# Patient Record
Sex: Male | Born: 1975 | Hispanic: No | Marital: Married | Smoking: Never smoker
Health system: Southern US, Community
[De-identification: ages and names within clinical notes are randomized; demographics above are authoritative.]

---

## 2010-01-01 ENCOUNTER — Telehealth: Payer: Self-pay | Admitting: Gastroenterology

## 2010-01-01 ENCOUNTER — Ambulatory Visit: Payer: Self-pay | Admitting: Gastroenterology

## 2010-01-01 ENCOUNTER — Encounter (INDEPENDENT_AMBULATORY_CARE_PROVIDER_SITE_OTHER): Payer: Self-pay | Admitting: *Deleted

## 2010-01-01 DIAGNOSIS — F458 Other somatoform disorders: Secondary | ICD-10-CM

## 2010-01-01 DIAGNOSIS — R1084 Generalized abdominal pain: Secondary | ICD-10-CM | POA: Insufficient documentation

## 2010-01-02 ENCOUNTER — Ambulatory Visit: Payer: Self-pay | Admitting: Gastroenterology

## 2010-01-05 ENCOUNTER — Ambulatory Visit (HOSPITAL_COMMUNITY): Admission: RE | Admit: 2010-01-05 | Discharge: 2010-01-05 | Payer: Self-pay | Admitting: Gastroenterology

## 2010-01-06 ENCOUNTER — Telehealth: Payer: Self-pay | Admitting: Gastroenterology

## 2010-01-12 ENCOUNTER — Encounter: Payer: Self-pay | Admitting: Gastroenterology

## 2010-01-23 ENCOUNTER — Ambulatory Visit (HOSPITAL_COMMUNITY): Admission: RE | Admit: 2010-01-23 | Discharge: 2010-01-23 | Payer: Self-pay | Admitting: Gastroenterology

## 2010-01-23 DIAGNOSIS — R7989 Other specified abnormal findings of blood chemistry: Secondary | ICD-10-CM | POA: Insufficient documentation

## 2010-01-26 ENCOUNTER — Telehealth: Payer: Self-pay | Admitting: Gastroenterology

## 2010-01-27 ENCOUNTER — Encounter: Payer: Self-pay | Admitting: Gastroenterology

## 2010-02-05 ENCOUNTER — Telehealth: Payer: Self-pay | Admitting: Gastroenterology

## 2010-05-28 ENCOUNTER — Ambulatory Visit: Payer: Self-pay | Admitting: Gastroenterology

## 2010-05-28 ENCOUNTER — Encounter: Payer: Self-pay | Admitting: Gastroenterology

## 2010-05-28 DIAGNOSIS — B370 Candidal stomatitis: Secondary | ICD-10-CM

## 2010-06-02 ENCOUNTER — Encounter: Payer: Self-pay | Admitting: Gastroenterology

## 2010-06-09 ENCOUNTER — Telehealth (INDEPENDENT_AMBULATORY_CARE_PROVIDER_SITE_OTHER): Payer: Self-pay | Admitting: *Deleted

## 2010-06-17 ENCOUNTER — Telehealth: Payer: Self-pay | Admitting: Gastroenterology

## 2010-07-07 NOTE — Letter (Signed)
Summary: EGD Instructions  [facility]  [address] [place], [place] [address]   Phone: [phone]  Fax: [phone]       VERDON FERRANTE    [birth year]    MRN: [id number]       Procedure Day /Date: Friday, 01/02/10     Arrival Time:  3:00     Procedure Time: 4:00     Location of Procedure:                    [facility] (4th Floor)    PREPARATION FOR ENDOSCOPY   On 01/02/10 THE DAY OF THE PROCEDURE:  1.   No solid foods, milk or milk products are allowed after midnight the night before your procedure.  2.   Do not drink anything colored red or purple.  Avoid juices with pulp.  No orange juice.  3.  You may drink clear liquids until 2:00, which is 2 hours before your procedure.                                                                                                CLEAR LIQUIDS INCLUDE: Water Jello Ice Popsicles Tea (sugar ok, no milk/cream) Powdered fruit flavored drinks Coffee (sugar ok, no milk/cream) Gatorade Juice: apple, white grape, white cranberry  Lemonade Clear bullion, consomm, broth Carbonated beverages (any kind) Strained chicken noodle soup Hard Candy   MEDICATION INSTRUCTIONS  Unless otherwise instructed, you should take regular prescription medications with a small sip of water as early as possible the morning of your procedure.                       OTHER INSTRUCTIONS  You will need a responsible adult at least 35 years of age to accompany you and drive you home.   This person must remain in the waiting room during your procedure.  Wear loose fitting clothing that is easily removed.  Leave jewelry and other valuables at home.  However, you may wish to bring a book to read or an iPod/MP3 player to listen to music as you wait for your procedure to start.  Remove all body piercing jewelry and leave at home.  Total time from sign-in until discharge is approximately 2-3 hours.  You should  go home directly after your procedure and rest.  You can resume normal activities the day after your procedure.  The day of your procedure you should not:   Drive   Make legal decisions   Operate machinery   Drink alcohol   Return to work  You will receive specific instructions about eating, activities and medications before you leave.    The above instructions have been reviewed and explained to me by   _______________________    I fully understand and can verbalize these instructions _____________________________ Date _________

## 2010-07-07 NOTE — Procedures (Signed)
Summary: Upper Endoscopy  Patient: [name] Note: All result statuses are Final unless otherwise noted.  Tests: (1) Upper Endoscopy (EGD)   EGD Upper Endoscopy       DONE     [facility]     [address]     [place], [place]  [address]           ENDOSCOPY PROCEDURE REPORT           PATIENT:  Carl Hinton, Carl Hinton  MR#:  [id number]     BIRTHDATE:  24-Jul-1975, 34 yrs. old  GENDER:  male           ENDOSCOPIST:  Vania Rea. Jarold Motto, MD, [facility]     Referred by:           PROCEDURE DATE:  01/02/2010     PROCEDURE:  EGD with biopsy     ASA CLASS:  Class I     INDICATIONS:  GAS,BLOATING AND EXCESSIVE BELCHING.NO RESPONSE TO     PPI RX.           MEDICATIONS:   Fentanyl 50 mcg IV, Versed 7 mg IV     TOPICAL ANESTHETIC:  Exactacain Spray           DESCRIPTION OF PROCEDURE:   After the risks benefits and     alternatives of the procedure were thoroughly explained, informed     consent was obtained.  The LB GIF-H180 [id number] endoscope was     introduced through the mouth and advanced to the second portion of     the duodenum, without limitations.  The instrument was slowly     withdrawn as the mucosa was fully examined.     <<PROCEDUREIMAGES>>           The upper, middle, and distal third of the esophagus were     carefully inspected and no abnormalities were noted. The z-line     was well seen at the GEJ. The endoscope was pushed into the fundus     which was normal including a retroflexed view. The antrum,gastric     body, first and second part of the duodenum were unremarkable.     DUODENAL AND GASTRIC AND CLO BX. DONE.    Retroflexed views     revealed no abnormalities.    The scope was then withdrawn from     the patient and the procedure completed.           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     RECOMMENDATIONS:     1) Await biopsy results  ULTRASOUND PENDING.MAY NEED GASTRIC EMPTYING SCAN.??? PRIMARY     GASTROPARESIS.           REPEAT EXAM:  No           ______________________________     Vania Rea. Jarold Motto, MD, [facility]           CC:           n.     eSIGNED:   Vania Rea. Patterson at 01/02/2010 04:23 PM           Carl Hinton, Carl Hinton, [id number]  Note: An exclamation mark (!) indicates a result that was not dispersed into the flowsheet. Document Creation Date: 01/02/2010 4:24 PM _______________________________________________________________________  (1) Order result status: Final Collection  or observation date-time: 01/02/2010 16:15 Requested date-time:  Receipt date-time:  Reported date-time:  Referring Physician:   Ordering Physician: Sheryn Bison [phone]) Specimen Source:  Source: Launa Grill Order Number: [id number] Lab site:

## 2010-07-07 NOTE — Assessment & Plan Note (Signed)
Summary: heart burn, abd pain...as.   History of Present Illness Visit Type: Initial Visit Primary GI MD: Sheryn Bison MD FACP FAGA Primary Provider: Javier Glazier, MD -[place], [place] Chief Complaint: Patient states that abdominal pain has subsided, however dyspepsia, bloating & burping still exist. Tis patient saw Dr. Loreta Ave 6 months ago. History of Present Illness:   35 year old Indian-American male who's been in good health all his life without serious medical or surgical problems. A year ago he developed probable prostatitis and was treated with a series of antibiotics including tetracycline, and has subsequently been bothered with abdominal gas, bloating, and excessive belching. He has tried multiple probiotics without improvement. He also has been on prolonged PPI therapy without improvement per GI consultation with Dr. Loreta Ave. He has not had previous endoscopy or ultrasound exams.  He denies typical reflux symptoms and has no burning or dysphagia. His appetite is good his weight is stable. He denies any specific hepatobiliary complaints or lower gastrointestinal problems. There is no history of excessive sorbitol or fructose use and he denies lactose intolerance. He is a vegetarian and takes daily multivitamins. There been no associated skin rashes, but he does have arthralgias in his hands. He is concerned that he may have a systemic yeast infection. Family history is noncontributory except for alcoholism associated liver disease in his father. The patient denies any previous episodes of hepatitis or pancreatitis. He works as a Research scientist (medical), does not smoke or use ethanol.   GI Review of Systems    Reports abdominal pain, acid reflux, belching, bloating, and  heartburn.     Location of  Abdominal pain: generalized.    Denies chest pain, dysphagia with liquids, dysphagia with solids, loss of appetite, nausea, vomiting, vomiting blood, weight loss, and  weight gain.      Reports  constipation.     Denies anal fissure, black tarry stools, change in bowel habit, diarrhea, diverticulosis, fecal incontinence, heme positive stool, hemorrhoids, irritable bowel syndrome, jaundice, light color stool, liver problems, rectal bleeding, and  rectal pain. Preventive Screening-Counseling & Management      Drug Use:  no.      Current Medications (verified): 1)  Probiotic Formula  Caps (Probiotic Product) .... Once Daily 2)  Multivitamins  Tabs (Multiple Vitamin) .... Once Daily 3)  Flax Seed Oil 1000 Mg Caps (Flaxseed (Linseed)) .... Once Daily  Allergies (verified): No Known Drug Allergies  Past History:  Past medical, surgical, family and social histories (including risk factors) reviewed for relevance to current acute and chronic problems.  Past Medical History: Back Pain GERD Urinary Tract Infection  Past Surgical History: Reviewed history from 12/29/2009 and no changes required. Right Eye Surgery  Family History: Reviewed history and no changes required. Family History of Diabetes:Uncle  Family History of Liver Disease/Cirrhosis:Father  Social History: Reviewed history from 12/29/2009 and no changes required. Patient has never smoked.  Alcohol Use - no Occupation: [employer] Drug Use - no Drug Use:  no  Review of Systems       The patient complains of allergy/sinus, back pain, confusion, fatigue, muscle pains/cramps, and thirst - excessive.  The patient denies anemia, anxiety-new, arthritis/joint pain, blood in urine, breast changes/lumps, change in vision, cough, coughing up blood, depression-new, fainting, fever, headaches-new, hearing problems, heart murmur, heart rhythm changes, itching, menstrual pain, night sweats, nosebleeds, pregnancy symptoms, shortness of breath, skin rash, sleeping problems, sore throat, swelling of feet/legs, swollen lymph glands, thirst - excessive , urination - excessive , urination changes/pain,  urine leakage, vision  changes, and voice change.         Chronic vague low back pain without incident use.  Vital Signs:  Patient profile:   35 year old male Height:      65 inches Weight:      122 pounds BMI:     20.38 Pulse rate:   76 / minute Pulse rhythm:   regular BP sitting:   110 / 72  (left arm) Cuff size:   regular  Vitals Entered By: June McMurray CMA Duncan Dull) (January 01, 2010 8:33 AM)  Physical Exam  General:  Well developed, well nourished, no acute distress.healthy appearing.   Head:  Normocephalic and atraumatic. Eyes:  PERRLA, no icterus.exam deferred to patient's ophthalmologist.   Mouth:  No deformity or lesions, dentition normal. Neck:  Supple; no masses or thyromegaly. Lungs:  Clear throughout to auscultation. Heart:  Regular rate and rhythm; no murmurs, rubs,  or bruits. Abdomen:  Soft, nontender and nondistended. No masses, hepatosplenomegaly or hernias noted. Normal bowel sounds. Msk:  Symmetrical with no gross deformities. Normal posture. Pulses:  Normal pulses noted. Extremities:  No clubbing, cyanosis, edema or deformities noted. Neurologic:  Alert and  oriented x4;  grossly normal neurologically. Cervical Nodes:  No significant cervical adenopathy. Psych:  Alert and cooperative. Normal mood and affect.   Impression & Recommendations:  Problem # 1:  AEROPHAGIA (ICD-306.4) Assessment Deteriorated Probable functional GI complaints---rule out H. pylori infection, large hiatal hernia, chronic malabsorption, versus occult bacterial overgrowth syndrome. Labs including celiac antibodies and H. pylori bodies have been ordered. We will proceed with endoscopy, gastric, small bowel biopsies. Ultrasound exam also has been ordered. Depending on his workup he may need additionally a gastric emptying scan, hydrogen breath testing, and perhaps esophageal manometry.  Patient Instructions: 1)  Please have your lab work drawn and fax the reuslts to [phone]. 2)  You are scheduled for an  ultrasound and an upper endoscopy. 3)  The medication list was reviewed and reconciled.  All changed / newly prescribed medications were explained.  A complete medication list was provided to the patient / caregiver.  Appended Document: heart burn, abd pain...as.    Clinical Lists Changes  Problems: Added new problem of ABDOMINAL PAIN, GENERALIZED (ICD-789.07) Orders: Added new Test order of EGD (EGD) - Signed Added new Test order of Ultrasound Abdomen (UAS) - Signed Added new Test order of EGD (EGD) - Signed      Appended Document: heart burn, abd pain...as.    Clinical Lists Changes  Orders: Added new Test order of Ultrasound Abdomen (UAS) - Signed      Appended Document: heart burn, abd pain...as. needs hemachromatosis genetic markers per elevated serum iron saturation on review of his meds records.  Appended Document: heart burn, abd pain...as. Pt notified.  Would like to have drawn at [facility] where he woeks.  Will come by and get order   Clinical Lists Changes  Problems: Added new problem of IRON, SERUM, ELEVATED (ICD-790.6) Orders: Added new Test order of T-Hemochromatosis [phone]) - Signed

## 2010-07-07 NOTE — Progress Notes (Signed)
Summary: labwork  Phone Note Call from Patient Call back at Home Phone [phone]   Caller: Patient Call For: Dr. Jarold Motto Reason for Call: Talk to Nurse Summary of Call: would like to verify that labwork order is ready and if wife can pick it up Initial call taken by: Vallarie Mare,  January 26, 2010 9:15 AM  Follow-up for Phone Call        Pt notified.  Lab order and info on gastrtoporosis at front desk.

## 2010-07-07 NOTE — Letter (Signed)
Summary: Patient Notice-Endo Biopsy Results  [facility]  [address] [place], [place] [address]   Phone: [phone]  Fax: [phone]        January 12, 2010 MRN: [id number]    [facility] [address] [place], [place]  [address]    Dear Carl Hinton,  I am pleased to inform you that the biopsies taken during your recent endoscopic examination did not show any evidence of cancer upon pathologic examination.  Additional information/recommendations:  __No further action is needed at this time.  Please follow-up with      your primary care physician for your other healthcare needs.  __ Please call [phone] to schedule a return visit to review      your condition.  _xx_ Continue with the treatment plan as outlined on the day of your      exam.Biopsies were negative for H. pylori infection.  __ You should have a repeat endoscopic examination for this problem              in _ months/years.   Please call us if you are having persistent problems or have questions about your condition that have not been fully answered at this time.  Sincerely,  Mardella Layman MD [facility]  This letter has been electronically signed by your physician.  Appended Document: Patient Notice-Endo Biopsy Results letter mailed

## 2010-07-07 NOTE — Progress Notes (Signed)
Summary: speak to nurse  Phone Note Call from Patient Call back at Home Phone [phone]   Caller: Patient Call For: Dr Jarold Motto Reason for Call: Talk to Nurse Summary of Call: Patient wants to speak to nurse regarding appt and bloodwork he had done today. Initial call taken by: Tawni Levy,  January 01, 2010 1:34 PM  Follow-up for Phone Call        Pt asks that we call [facility] re labs ordered today.  He is going to have lab drawn there,  He works for [employer] and can get labs drawn at no charge.  Call [phone]. Called and answered questions re H-pyori blood test.  (should be IGG) Follow-up by: Ashok Cordia RN,  January 01, 2010 2:03 PM

## 2010-07-07 NOTE — Progress Notes (Signed)
Summary: Gastric emptying scan  Phone Note Outgoing Call   Summary of Call: Per Dr. Jarold Motto.  If [place] negative, schedule gastric emtpying scan. Scheduled for 01/23/10 at 8:00 at [facility].   NPO after midnight. Initial call taken by: Ashok Cordia RN,  January 06, 2010 8:59 AM  Follow-up for Phone Call        Pt notified of appt for gastric emptying scan.  Notified of negative [place] results. Follow-up by: Ashok Cordia RN,  January 06, 2010 8:59 AM     Appended Document: Gastric emptying scan    Clinical Lists Changes  Orders: Added new Test order of Gastric Emptying Scan (GES) - Signed

## 2010-07-07 NOTE — Progress Notes (Signed)
Summary: Lab results  Phone Note Call from Patient Call back at Home Phone [phone]   Call For: Dr Jarold Motto Reason for Call: Lab or Test Results Initial call taken by: Leanor Kail [facility],  February 05, 2010 3:42 PM  Follow-up for Phone Call        LM for pt to call.  Ashok Cordia RN  February 05, 2010 4:43 PM  Pt asking for results of last labs drawn at [facility].  Called [facility] and resuts are to be faxed to [place].  Pt instructed to call back next week re these results. Follow-up by: Ashok Cordia RN,  February 06, 2010 2:26 PM     Appended Document: Lab results Results received.  To Dr. Jarold Motto for review.

## 2010-07-09 NOTE — Progress Notes (Signed)
Summary: Dry skin  Phone Note Other Incoming Call back at Pt walked in   Summary of Call: Patient walked in today to ask if Diflucan can cause dry skin and if he needed to worry about it. Patient was seen on 05/28/10 and Dr Jarold Motto ordered Diflucan 100 mg two times a daily day 1 and then 100mg  daily x 2 weeks. Patient stated he usually has oily skin and he noticed 3 days into the Diflucan the dryness. I did not notice any rash, redness or scaly areas where he was pointing on his face. I showed him in the Nurse's Drug Book where Diflucan may cause a rash. I informed him I will send Dr Jarold Motto a note and get back with him. Patient then asked if we received his lab results from [facility] and I found them which appeared normal. I offered him a copy and he refused stating he wouldn't know what he was looking at. I again will have Dr Jarold Motto look at the lab results and get back with him; [t stated understanding.  Initial call taken by: Graciella Freer RN  June 09, 2010 9:29 AM   Follow-up for Phone Call        [facility] RX...LAB TEST FOR HEMACHROMATOSIS IS NEGATIVE.Marland Kitchen Follow-up by: Mardella Layman MD Roxanne Gates 12:23 PM  Additional Follow-up for Phone Call Additional follow up Details #1::        Notified patient that Dr Jarold Motto instructed him to finish the rx for Diflucan and informed him the test for Hemachromatosis was negative. Patient stated understanding. Additional Follow-up by: Graciella Freer RN,  June 10, 2010 2:58 PM

## 2010-07-09 NOTE — Progress Notes (Signed)
Summary: update on meds  Phone Note Call from Patient Call back at Home Phone [phone]   Caller: Patient Call For: Dr Jarold Motto Reason for Call: Talk to Nurse Summary of Call: Patient is calling to update on rx given to him but wants to speak directly to nurse. Initial call taken by: Tawni Levy,  June 17, 2010 8:42 AM  Follow-up for Phone Call        Patient called in to report he has finished the Diflucan. He reports his mouth is better, but he still  has a few white patches left and he still has a problem with burping. Patient wants to know if you want to reorder the Diflucan? Graciella Freer RN  June 17, 2010 1:38 PM  Follow-up by: Mardella Layman MD FACG,  June 17, 2010 2:28 PM  Additional Follow-up for Phone Call Additional follow up Details #1::        HIS RX. SHOULD BE COMPLETE Additional Follow-up by: Mardella Layman MD FACG,  June 17, 2010 2:28 PM    Additional Follow-up for Phone Call Additional follow up Details #2::    Informed patient per Dr Jarold Motto , his rx and GI workup is complete. Call for further problems. Patient stated understanding. Follow-up by: Graciella Freer RN,  June 17, 2010 3:54 PM

## 2010-07-09 NOTE — Assessment & Plan Note (Signed)
Summary: abd pain...as.   History of Present Illness Visit Type: Follow-up Visit Primary GI MD: Sheryn Bison MD FACP FAGA Primary Provider: Javier Glazier, MD -[place], [place] Chief Complaint: still c/o abdominal pain and c/o thrush in mouth History of Present Illness:   35 year old male patient who has chronic functional dyspepsia. He has had recent GI evaluation including endoscopy, ultrasonography, and gastric emptying scan all of which have been normal. He continues with gas, bloating, and is convinced that he has a systemic Candida albicans infection. He does complain of oral stomatitis but no real difficulty swallowing. He was on a course of amoxicillin 1-1/2-2 weeks ago for a inspiratory infection. He currently is on probiotics, multivitamins, and a natural homeopathy solution for Candida.  Patient has had an approximate 10 pound weight loss over the last several years, but denies any symptoms of malabsorption, and there is no history of hepatitis or pancreatitis. Family history is noncontributory. He denies systemic complaints such as fever, chills, skin rashes, joint swelling et Karie Soda.   GI Review of Systems    Reports abdominal pain and  belching.      Denies acid reflux, bloating, chest pain, dysphagia with liquids, dysphagia with solids, heartburn, loss of appetite, nausea, vomiting, vomiting blood, weight loss, and  weight gain.        Denies anal fissure, black tarry stools, change in bowel habit, constipation, diarrhea, diverticulosis, fecal incontinence, heme positive stool, hemorrhoids, irritable bowel syndrome, jaundice, light color stool, liver problems, rectal bleeding, and  rectal pain.    Current Medications (verified): 1)  Probiotic Formula  Caps (Probiotic Product) .... Once Daily 2)  Multivitamins  Tabs (Multiple Vitamin) .... Once Daily 3)  Fish Oil 1000 Mg Caps (Omega-3 Fatty Acids) .Marland Kitchen.. 1 By Mouth Once Daily 4)  Kolorex .Marland KitchenMarland Kitchen. 1 By Mouth Once Daily  Allergies  (verified): No Known Drug Allergies  Past History:  Past medical, surgical, family and social histories (including risk factors) reviewed for relevance to current acute and chronic problems.  Past Medical History: Reviewed history from 01/01/2010 and no changes required. Back Pain GERD Urinary Tract Infection  Past Surgical History: Reviewed history from 12/29/2009 and no changes required. Right Eye Surgery  Family History: Reviewed history from 01/01/2010 and no changes required. Family History of Diabetes:Uncle  Family History of Liver Disease/Cirrhosis:Father No FH of Colon Cancer:  Social History: Reviewed history from 01/01/2010 and no changes required. Patient has never smoked.  Alcohol Use - no Occupation: [employer] Drug Use - no Married 1 boy Daily Caffeine Use  Review of Systems  The patient denies allergy/sinus, anemia, anxiety-new, arthritis/joint pain, back pain, blood in urine, breast changes/lumps, change in vision, confusion, cough, coughing up blood, depression-new, fainting, fatigue, fever, headaches-new, hearing problems, heart murmur, heart rhythm changes, itching, muscle pains/cramps, night sweats, nosebleeds, shortness of breath, skin rash, sleeping problems, sore throat, swelling of feet/legs, swollen lymph glands, thirst - excessive, urination - excessive, urination changes/pain, urine leakage, vision changes, and voice change.         complaints of oral stomatitis.  Vital Signs:  Patient profile:   35 year old male Height:      65 inches Weight:      121 pounds BMI:     20.21 Pulse rate:   84 / minute Pulse rhythm:   regular BP sitting:   118 / 78  (left arm)  Vitals Entered By: Milford Cage NCMA (May 28, 2010 8:52 AM)  Physical Exam  General:  Well  developed, well nourished, no acute distress.healthy appearing.   Head:  Normocephalic and atraumatic. Eyes:  PERRLA, no icterus.exam deferred to patient's ophthalmologist.     Mouth:  inspection of the mucous membranes is fairly unremarkable although there is some white exudate in the left buccal area. Neck:  Supple; no masses or thyromegaly. Lungs:  Clear throughout to auscultation. Heart:  Regular rate and rhythm; no murmurs, rubs,  or bruits. Abdomen:  Soft, nontender and nondistended. No masses, hepatosplenomegaly or hernias noted. Normal bowel sounds. Extremities:  No clubbing, cyanosis, edema or deformities noted. Neurologic:  Alert and  oriented x4;  grossly normal neurologically. Psych:  Alert and cooperative. Normal mood and affect.   Impression & Recommendations:  Problem # 1:  IRON, SERUM, ELEVATED (ICD-790.6) Assessment Unchanged Hemochromatosis genetic studies were negative. We will repeat his labs through [employer] where he is employed. I have ordered CBC, anemia profile, metabolic profile, sprue serologies, and immunoelectrophoresis.  Problem # 2:  ABDOMINAL PAIN, GENERALIZED (ICD-789.07) Assessment: Improved  Problem # 3:  CANDIDIASIS OF MOUTH (ICD-112.0) Assessment: Unchanged Probable mild Candida stomatitis related to recent antibiotic use. The patient is greatly concerned that he has a systemic yeast infection. There is no history of immunodeficiency, prednisone use, etc. However, I have decided to treat him with Diflucan 100 mg twice a day on day one followed by 100 mg a day for 2 weeks, and we will see how he does symptomatically. Again, labs have been ordered to exclude any immunodeficiency syndrome or other abnormalities. There is no history of diabetes or other endocrine dysfunction, her blood glucose and thyroid functions also ordered.  Problem # 4:  AEROPHAGIA (ICD-306.4) Assessment: Unchanged  Patient Instructions: 1)  Copy sent to : Javier Glazier, MD -[place], [place] 2)  We are sending in your prescription today to your pharmacy 3)  Orders were given to you today to take to [facility] to have your labs drawn there 4)  Please fax lab  results to [phone] 5)  The medication list was reviewed and reconciled.  All changed / newly prescribed medications were explained.  A complete medication list was provided to the patient / caregiver. Prescriptions: DIFLUCAN 100 MG TABS (FLUCONAZOLE) 1 by mouth two times a day for the first day the once daily for 14 days  #16 x 0   Entered by:   Merri Ray CMA (AAMA)   Authorized by:   Mardella Layman MD [employer]   Signed by:   Merri Ray CMA (AAMA) on 05/28/2010   Method used:   Electronically to        [id number] (retail)       [address] [place], [place]  [address]       Ph: [phone]       Fax: [phone]   RxID:   [id number]

## 2011-07-15 ENCOUNTER — Ambulatory Visit
Admission: RE | Admit: 2011-07-15 | Discharge: 2011-07-15 | Disposition: A | Discharge status: Home / Self Care | Payer: No Typology Code available for payment source | Source: Ambulatory Visit | Attending: Infectious Diseases | Admitting: Infectious Diseases

## 2011-07-15 ENCOUNTER — Other Ambulatory Visit: Payer: Self-pay | Admitting: Infectious Diseases

## 2011-07-15 DIAGNOSIS — R7611 Nonspecific reaction to tuberculin skin test without active tuberculosis: Secondary | ICD-10-CM

## 2011-07-15 IMAGING — CR DG CHEST 1V
1 series · 1 of 1 positions shown · non-contrast
Comparison: None.

CLINICAL DATA: Positive TB test

CHEST - 1 VIEW

[view not recorded]
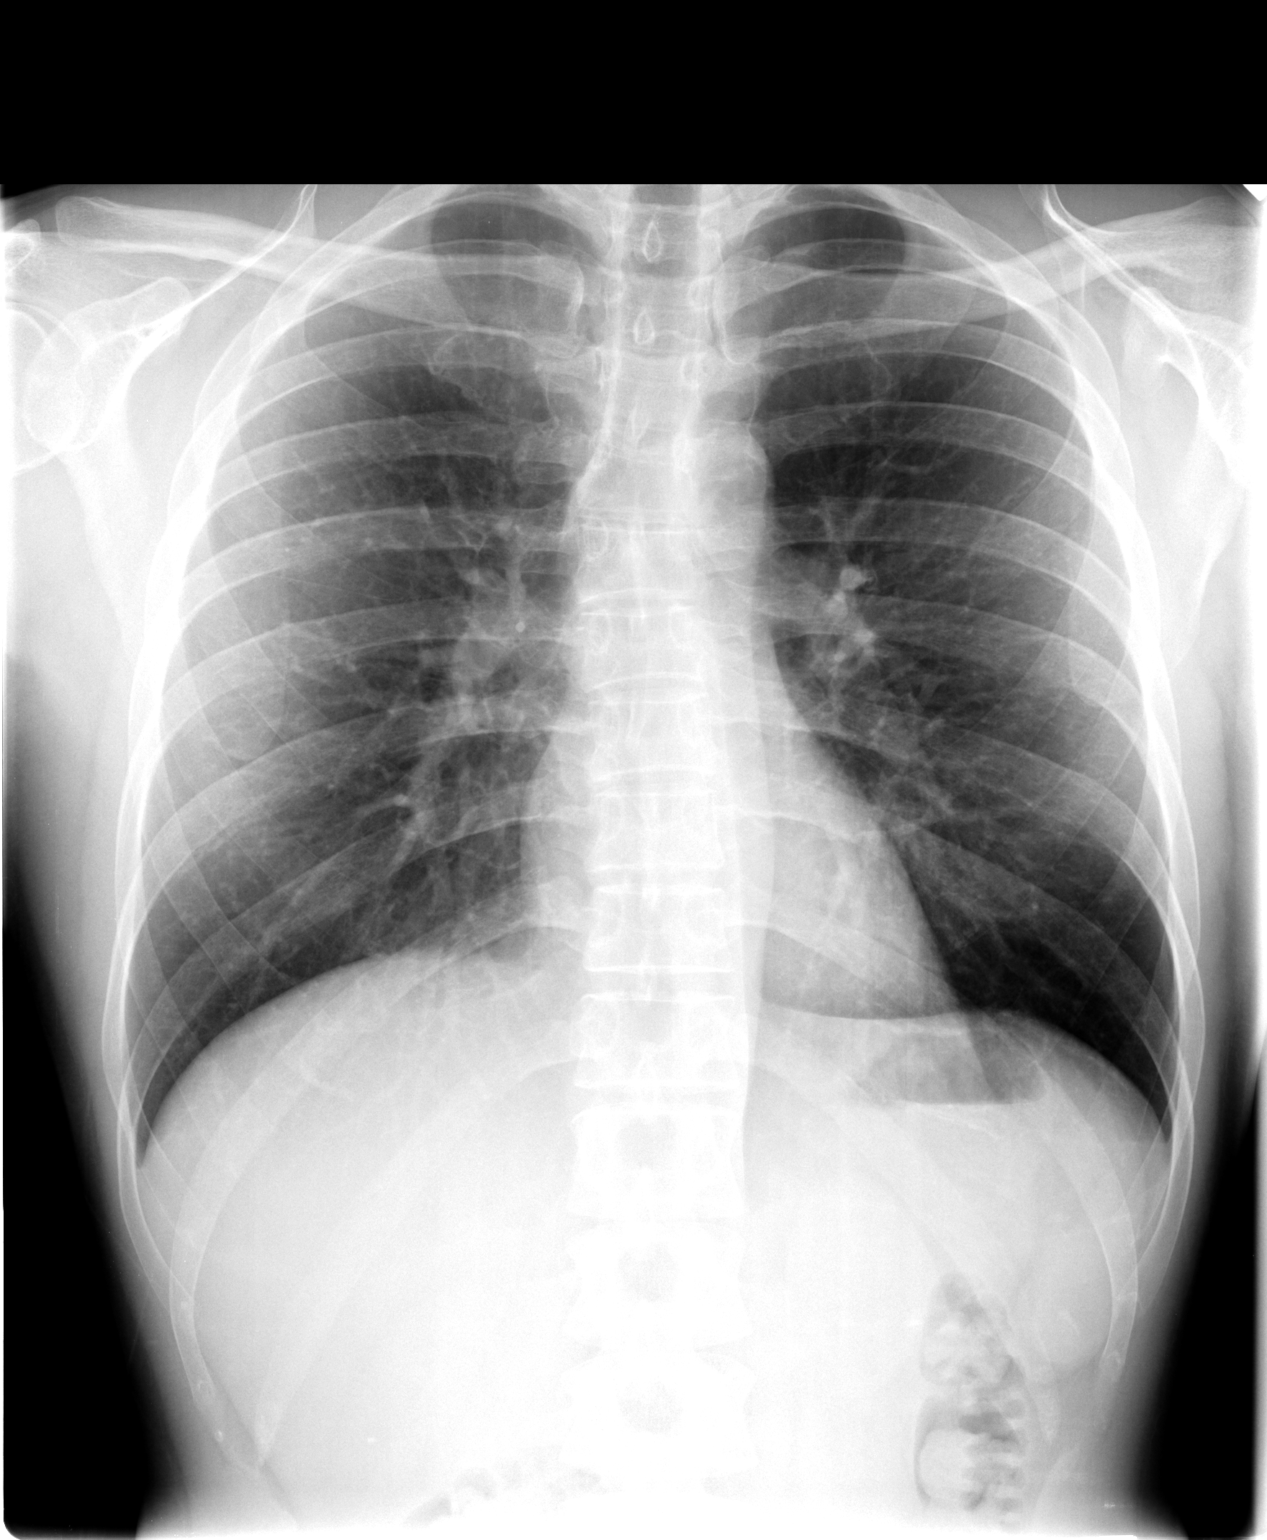

[1 of 1 positions shown; findings below may reference images not displayed]

FINDINGS: The lungs are clear.  No evidence of prior tuberculous
infection is seen.  Mediastinal contours appear normal.  The heart
is within normal limits in size.  No bony abnormality is seen.
IMPRESSION: No active lung disease.

## 2015-07-22 ENCOUNTER — Encounter: Payer: Self-pay | Admitting: Gastroenterology

## 2016-03-04 DIAGNOSIS — Z0000 Encounter for general adult medical examination without abnormal findings: Secondary | ICD-10-CM | POA: Diagnosis not present

## 2016-03-04 DIAGNOSIS — Z136 Encounter for screening for cardiovascular disorders: Secondary | ICD-10-CM | POA: Diagnosis not present

## 2016-03-09 DIAGNOSIS — Z0000 Encounter for general adult medical examination without abnormal findings: Secondary | ICD-10-CM | POA: Diagnosis not present

## 2016-09-23 DIAGNOSIS — N481 Balanitis: Secondary | ICD-10-CM | POA: Diagnosis not present

## 2017-03-07 DIAGNOSIS — Z1329 Encounter for screening for other suspected endocrine disorder: Secondary | ICD-10-CM | POA: Diagnosis not present

## 2017-03-07 DIAGNOSIS — Z0000 Encounter for general adult medical examination without abnormal findings: Secondary | ICD-10-CM | POA: Diagnosis not present

## 2017-03-07 DIAGNOSIS — Z13220 Encounter for screening for lipoid disorders: Secondary | ICD-10-CM | POA: Diagnosis not present

## 2017-03-07 DIAGNOSIS — Z114 Encounter for screening for human immunodeficiency virus [HIV]: Secondary | ICD-10-CM | POA: Diagnosis not present

## 2017-03-07 DIAGNOSIS — Z125 Encounter for screening for malignant neoplasm of prostate: Secondary | ICD-10-CM | POA: Diagnosis not present

## 2017-03-09 DIAGNOSIS — Z0000 Encounter for general adult medical examination without abnormal findings: Secondary | ICD-10-CM | POA: Diagnosis not present

## 2017-03-09 DIAGNOSIS — Z6821 Body mass index (BMI) 21.0-21.9, adult: Secondary | ICD-10-CM | POA: Diagnosis not present

## 2017-10-25 DIAGNOSIS — E782 Mixed hyperlipidemia: Secondary | ICD-10-CM | POA: Diagnosis not present

## 2017-10-25 DIAGNOSIS — R739 Hyperglycemia, unspecified: Secondary | ICD-10-CM | POA: Diagnosis not present

## 2017-10-25 DIAGNOSIS — Z6821 Body mass index (BMI) 21.0-21.9, adult: Secondary | ICD-10-CM | POA: Diagnosis not present

## 2017-10-25 DIAGNOSIS — R5383 Other fatigue: Secondary | ICD-10-CM | POA: Diagnosis not present

## 2018-03-10 DIAGNOSIS — Z0000 Encounter for general adult medical examination without abnormal findings: Secondary | ICD-10-CM | POA: Diagnosis not present

## 2018-03-10 DIAGNOSIS — Z13220 Encounter for screening for lipoid disorders: Secondary | ICD-10-CM | POA: Diagnosis not present

## 2018-03-10 DIAGNOSIS — Z114 Encounter for screening for human immunodeficiency virus [HIV]: Secondary | ICD-10-CM | POA: Diagnosis not present

## 2018-03-10 DIAGNOSIS — Z125 Encounter for screening for malignant neoplasm of prostate: Secondary | ICD-10-CM | POA: Diagnosis not present

## 2018-03-10 DIAGNOSIS — Z1329 Encounter for screening for other suspected endocrine disorder: Secondary | ICD-10-CM | POA: Diagnosis not present

## 2018-03-13 DIAGNOSIS — Z6821 Body mass index (BMI) 21.0-21.9, adult: Secondary | ICD-10-CM | POA: Diagnosis not present

## 2018-03-13 DIAGNOSIS — Z0000 Encounter for general adult medical examination without abnormal findings: Secondary | ICD-10-CM | POA: Diagnosis not present

## 2018-03-13 DIAGNOSIS — Z23 Encounter for immunization: Secondary | ICD-10-CM | POA: Diagnosis not present

## 2019-03-14 DIAGNOSIS — R5383 Other fatigue: Secondary | ICD-10-CM | POA: Diagnosis not present

## 2019-03-14 DIAGNOSIS — E559 Vitamin D deficiency, unspecified: Secondary | ICD-10-CM | POA: Diagnosis not present

## 2019-03-14 DIAGNOSIS — D509 Iron deficiency anemia, unspecified: Secondary | ICD-10-CM | POA: Diagnosis not present

## 2019-03-14 DIAGNOSIS — Z0000 Encounter for general adult medical examination without abnormal findings: Secondary | ICD-10-CM | POA: Diagnosis not present

## 2019-03-21 DIAGNOSIS — Z6821 Body mass index (BMI) 21.0-21.9, adult: Secondary | ICD-10-CM | POA: Diagnosis not present

## 2019-03-21 DIAGNOSIS — E559 Vitamin D deficiency, unspecified: Secondary | ICD-10-CM | POA: Diagnosis not present

## 2019-03-21 DIAGNOSIS — L853 Xerosis cutis: Secondary | ICD-10-CM | POA: Diagnosis not present

## 2019-03-21 DIAGNOSIS — L84 Corns and callosities: Secondary | ICD-10-CM | POA: Diagnosis not present

## 2019-03-21 DIAGNOSIS — Z0000 Encounter for general adult medical examination without abnormal findings: Secondary | ICD-10-CM | POA: Diagnosis not present

## 2019-04-30 DIAGNOSIS — Z20828 Contact with and (suspected) exposure to other viral communicable diseases: Secondary | ICD-10-CM | POA: Diagnosis not present

## 2019-12-13 DIAGNOSIS — D539 Nutritional anemia, unspecified: Secondary | ICD-10-CM | POA: Diagnosis not present

## 2019-12-13 DIAGNOSIS — R5383 Other fatigue: Secondary | ICD-10-CM | POA: Diagnosis not present

## 2019-12-13 DIAGNOSIS — Z0000 Encounter for general adult medical examination without abnormal findings: Secondary | ICD-10-CM | POA: Diagnosis not present

## 2019-12-13 DIAGNOSIS — E291 Testicular hypofunction: Secondary | ICD-10-CM | POA: Diagnosis not present

## 2020-01-03 DIAGNOSIS — Z20822 Contact with and (suspected) exposure to covid-19: Secondary | ICD-10-CM | POA: Diagnosis not present

## 2020-01-22 DIAGNOSIS — E611 Iron deficiency: Secondary | ICD-10-CM | POA: Diagnosis not present

## 2020-01-22 DIAGNOSIS — K219 Gastro-esophageal reflux disease without esophagitis: Secondary | ICD-10-CM | POA: Diagnosis not present

## 2020-01-22 DIAGNOSIS — R5383 Other fatigue: Secondary | ICD-10-CM | POA: Diagnosis not present

## 2020-04-27 ENCOUNTER — Ambulatory Visit (HOSPITAL_COMMUNITY)
Admission: EM | Admit: 2020-04-27 | Discharge: 2020-04-28 | Disposition: A | Discharge status: Home / Self Care | Payer: BC Managed Care – PPO | Attending: Urology | Admitting: Urology

## 2020-04-27 ENCOUNTER — Other Ambulatory Visit: Payer: Self-pay

## 2020-04-27 ENCOUNTER — Emergency Department (HOSPITAL_COMMUNITY): Payer: BC Managed Care – PPO

## 2020-04-27 ENCOUNTER — Encounter (HOSPITAL_COMMUNITY): Payer: Self-pay | Admitting: Emergency Medicine

## 2020-04-27 DIAGNOSIS — N4889 Other specified disorders of penis: Secondary | ICD-10-CM | POA: Diagnosis not present

## 2020-04-27 DIAGNOSIS — Z20822 Contact with and (suspected) exposure to covid-19: Secondary | ICD-10-CM | POA: Diagnosis not present

## 2020-04-27 DIAGNOSIS — N433 Hydrocele, unspecified: Secondary | ICD-10-CM | POA: Insufficient documentation

## 2020-04-27 NOTE — Consult Note (Addendum)
Urology Consult   Physician requesting consult: Tilden Fossa, MD  Reason for consult: Penile pain, possible fracture  History of Present Illness: Carl Hinton is a 44 y.o. who presents to the ED complaining of injury to his penis.  He states that around 2 PM today he was in a hot tub with his wife having sexual intercourse and pulled out noticing that he had penile edema.  He denies hearing or feeling a pop.  He denies any significant penile pain.  He does have some discomfort with associated swelling.  He did experience detumescence however this does not appear to be all of a sudden.  He has not tried to obtain an erection after this event.  He denies hematuria or blood per urethral meatus.  He denies dysuria, frequency or urgency.  He states he has been voiding without difficulty.  He did present to an urgent care earlier and was transferred to [facility] for further care.  He denies a history of voiding or storage urinary symptoms, hematuria, UTIs, STDs, urolithiasis, GU malignancy/trauma/surgery.  History reviewed. No pertinent past medical history.  History reviewed. No pertinent surgical history.  Medications:  Home meds:  No current facility-administered medications on file prior to encounter.   No current outpatient medications on file prior to encounter.     Scheduled Meds: Continuous Infusions: PRN Meds:.  Allergies: Not on File  No family history on file.  Social History:  reports that he has never smoked. He has never used smokeless tobacco. No history on file for alcohol use and drug use.  ROS: A complete review of systems was performed.  All systems are negative except for pertinent findings as noted.  Physical Exam:  Vital signs in last 24 hours: Temp:  [98 F (36.7 C)] 98 F (36.7 C) (11/21 1621) Pulse Rate:  [63-89] 63 (11/21 1848) Resp:  [18-19] 18 (11/21 1848) BP: (123-136)/(78) 123/78 (11/21 1848) SpO2:  [100 %] 100 % (11/21  1848) Constitutional:  Alert and oriented, No acute distress Cardiovascular: Regular rate and rhythm Respiratory: Normal respiratory effort, Lungs clear bilaterally GI: Abdomen is soft, nontender, nondistended, no abdominal masses Genitourinary: No CVAT.  Penile edema and ecchymosis present circumferentially around the penile shaft.  Meatus is patent with no blood at the meatus.  No significant tenderness.  I am unable to palpate a tunical defect however this is difficult given the amount of edema. Bilateral testicles are nontender with no masses.  The ecchymosis and edema stops at the base of the penile shaft. Neurologic: Grossly intact, no focal deficits Psychiatric: Normal mood and affect  Laboratory Data:  No results for input(s): WBC, HGB, HCT, PLT in the last 72 hours.  No results for input(s): NA, K, CL, GLUCOSE, BUN, CALCIUM, CREATININE in the last 72 hours.  Invalid input(s): CO3   No results found for this or any previous visit (from the past 24 hour(s)). No results found for this or any previous visit (from the past 240 hour(s)).  Renal Function: No results for input(s): CREATININE in the last 168 hours. CrCl cannot be calculated (No successful lab value found.).  Radiologic Imaging: No results found.  I independently reviewed the above imaging studies.  Impression/Recommendation 1. Penile injury/questionable penile fracture  -I explained options to proceed including penile exploration or MRI of the penis to evaluate for possible fracture.  I explained that his case is not straightforward as he did not notice a pop or develop significant pain during his event.  He  merely noticed swelling after pulling out.  He elects to obtain an MRI of the penis to evaluate for penile fracture.  If evidence for fracture, we will proceed with penile exploration and repair of fracture.  Discussed risks and benefits.  Jannifer Hick 04/27/2020, 7:35 PM  Matt R. Dayten Juba MD [facility]   Pager: [phone]   CC: Tilden Fossa, MD  Addendum 12:21AM: Just discussed with Carl Hinton with radiology. 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 Will proceed to OR for penile exploration, repair of defect. Discussed with patient. Still awaiting COVID test result prior to proceeding.  Matt R. Neah Sporrer MD [facility]  Pager: [phone]

## 2020-04-27 NOTE — ED Provider Notes (Signed)
[facility]-EMERGENCY DEPT Provider Note   CSN: [id number] Arrival date & time: 04/27/20  1559     History Chief Complaint  Patient presents with   Penis Pain    Monterius H Segundo is a 44 y.o. male.  The history is provided by the patient.  Penis Pain   Rivan H Sievers is a 44 y.o. male who presents to the Emergency Department complaining of penile injury. He presents the emergency department complaining of swelling of the shaft of his penis that occurred earlier today. He states that he was having intercourse with his wife and after about 4 to 5 minutes when he pulled out he noticed that there was significant circumferential swelling to his penile shaft. There is mild local discomfort but no significant pain. He is able to void without difficulty. He has a history of iron deficiency, no additional medical problems. No penile discharge, dysuria. He was seen in urgent care and treated with a steroid shot and referred to the emergency department for further evaluation.    History reviewed. No pertinent past medical history.  Patient Active Problem List   Diagnosis Date Noted   CANDIDIASIS OF MOUTH 05/28/2010   IRON, SERUM, ELEVATED 01/23/2010   AEROPHAGIA 01/01/2010   ABDOMINAL PAIN, GENERALIZED 01/01/2010    History reviewed. No pertinent surgical history.     No family history on file.  Social History   Tobacco Use   Smoking status: Never Smoker   Smokeless tobacco: Never Used  Substance Use Topics   Alcohol use: Not on file   Drug use: Not on file    Home Medications Prior to Admission medications   Not on File    Allergies    Patient has no allergy information on record.  Review of Systems   Review of Systems  Genitourinary: Positive for penile pain.  All other systems reviewed and are negative.   Physical Exam Updated Vital Signs BP 131/88    Pulse 77    Temp 98 F (36.7 C) (Oral)    Resp 17    SpO2 100%   Physical  Exam Vitals and nursing note reviewed.  Constitutional:      Appearance: He is well-developed.  HENT:     Head: Normocephalic and atraumatic.  Cardiovascular:     Rate and Rhythm: Normal rate and regular rhythm.     Heart sounds: No murmur heard.   Pulmonary:     Effort: Pulmonary effort is normal. No respiratory distress.     Breath sounds: Normal breath sounds.  Abdominal:     Palpations: Abdomen is soft.     Tenderness: There is no abdominal tenderness. There is no guarding or rebound.  Genitourinary:    Comments: Circumcised penis. Normal testicles. There is no significant penile tenderness. There is moderate shaft edema with ecchymosis on the dorsal surface. Musculoskeletal:        General: No tenderness.  Skin:    General: Skin is warm and dry.  Neurological:     Mental Status: He is alert and oriented to person, place, and time.  Psychiatric:        Behavior: Behavior normal.     ED Results / Procedures / Treatments   Labs (all labs ordered are listed, but only abnormal results are displayed) Labs Reviewed - No data to display  EKG None  Radiology No results found.  Procedures Procedures (including critical care time)  Medications Ordered in ED Medications - No data to display  ED Course  I have reviewed the triage vital signs and the nursing notes.  Pertinent labs & imaging results that were available during my care of the patient were reviewed by me and considered in my medical decision making (see chart for details).    MDM Rules/Calculators/A&P                         Patient here for evaluation of swelling to the penile shaft after intercourse. He has swelling throughout this shop with ecchymosis, mild local pain. Discussed with Dr. Cardell Peach, with urology who evaluated the patient in consult. Plan to obtain MRI to further evaluate extent of injuries. Patient care transferred pending further urology recommendations.  Final Clinical Impression(s) / ED  Diagnoses Final diagnoses:  Penile pain    Rx / DC Orders ED Discharge Orders    None       Tilden Fossa, MD 04/27/20 2315

## 2020-04-27 NOTE — ED Triage Notes (Signed)
Patient here from home reporting penis shaft pain after intercourse today. Swelling. Denies bleeding.

## 2020-04-28 ENCOUNTER — Emergency Department (HOSPITAL_COMMUNITY): Payer: BC Managed Care – PPO | Admitting: Certified Registered Nurse Anesthetist

## 2020-04-28 ENCOUNTER — Encounter (HOSPITAL_COMMUNITY)
Admission: EM | Disposition: A | Discharge status: Home / Self Care | Payer: Self-pay | Source: Home / Self Care | Attending: Emergency Medicine

## 2020-04-28 HISTORY — PX: REPAIR OF FRACTURED PENIS: SHX6063

## 2020-04-28 LAB — RESP PANEL BY RT-PCR (FLU A&B, COVID) ARPGX2
Influenza A by PCR: NEGATIVE
Influenza B by PCR: NEGATIVE
SARS Coronavirus 2 by RT PCR: NEGATIVE

## 2020-04-28 SURGERY — REPAIR, FRACTURE, PENIS
Anesthesia: General

## 2020-04-28 MED ORDER — CEFAZOLIN SODIUM-DEXTROSE 2-3 GM-%(50ML) IV SOLR
INTRAVENOUS | Status: DC | PRN
  Administered 2020-04-28: 2 g via INTRAVENOUS

## 2020-04-28 MED ORDER — SUGAMMADEX SODIUM 500 MG/5ML IV SOLN
INTRAVENOUS | Status: AC
  Filled 2020-04-28: qty 5

## 2020-04-28 MED ORDER — DOCUSATE SODIUM 100 MG PO CAPS: 100.0000 mg | ORAL_CAPSULE | Freq: Every day | ORAL | 1 refills | Status: AC | PRN

## 2020-04-28 MED ORDER — MIDAZOLAM HCL 2 MG/2ML IJ SOLN
INTRAMUSCULAR | Status: AC
  Filled 2020-04-28: qty 2

## 2020-04-28 MED ORDER — ACETAMINOPHEN 10 MG/ML IV SOLN: 1000.0000 mg | Freq: Once | INTRAVENOUS | Status: DC | PRN

## 2020-04-28 MED ORDER — LACTATED RINGERS IV SOLN: INTRAVENOUS | Status: DC | PRN

## 2020-04-28 MED ORDER — CIPROFLOXACIN HCL 500 MG PO TABS: 500.0000 mg | ORAL_TABLET | Freq: Two times a day (BID) | ORAL | 0 refills | Status: AC

## 2020-04-28 MED ORDER — ROCURONIUM BROMIDE 10 MG/ML (PF) SYRINGE
PREFILLED_SYRINGE | INTRAVENOUS | Status: DC | PRN
  Administered 2020-04-28: 20 mg via INTRAVENOUS
  Administered 2020-04-28: 50 mg via INTRAVENOUS

## 2020-04-28 MED ORDER — FENTANYL CITRATE (PF) 100 MCG/2ML IJ SOLN: 25.0000 ug | INTRAMUSCULAR | Status: DC | PRN

## 2020-04-28 MED ORDER — PROPOFOL 10 MG/ML IV BOLUS
INTRAVENOUS | Status: AC
  Filled 2020-04-28: qty 20

## 2020-04-28 MED ORDER — ACETAMINOPHEN 160 MG/5ML PO SOLN: 1000.0000 mg | Freq: Once | ORAL | Status: DC | PRN

## 2020-04-28 MED ORDER — MIDAZOLAM HCL 5 MG/5ML IJ SOLN
INTRAMUSCULAR | Status: DC | PRN
  Administered 2020-04-28: 2 mg via INTRAVENOUS

## 2020-04-28 MED ORDER — HYDROMORPHONE HCL 1 MG/ML IJ SOLN: 0.2500 mg | INTRAMUSCULAR | Status: DC | PRN

## 2020-04-28 MED ORDER — FENTANYL CITRATE (PF) 100 MCG/2ML IJ SOLN
INTRAMUSCULAR | Status: AC
  Filled 2020-04-28: qty 2

## 2020-04-28 MED ORDER — CEFAZOLIN SODIUM-DEXTROSE 2-4 GM/100ML-% IV SOLN
INTRAVENOUS | Status: AC
  Filled 2020-04-28: qty 100

## 2020-04-28 MED ORDER — PROMETHAZINE HCL 25 MG/ML IJ SOLN
INTRAMUSCULAR | Status: AC
  Filled 2020-04-28: qty 1

## 2020-04-28 MED ORDER — ONDANSETRON HCL 4 MG/2ML IJ SOLN
INTRAMUSCULAR | Status: DC | PRN
  Administered 2020-04-28: 4 mg via INTRAVENOUS

## 2020-04-28 MED ORDER — BUPIVACAINE HCL (PF) 0.25 % IJ SOLN
INTRAMUSCULAR | Status: DC | PRN
  Administered 2020-04-28: 30 mL

## 2020-04-28 MED ORDER — LIDOCAINE 2% (20 MG/ML) 5 ML SYRINGE
INTRAMUSCULAR | Status: AC
  Filled 2020-04-28: qty 5

## 2020-04-28 MED ORDER — OXYCODONE-ACETAMINOPHEN 5-325 MG PO TABS: 1.0000 | ORAL_TABLET | ORAL | 0 refills | Status: AC | PRN

## 2020-04-28 MED ORDER — DEXAMETHASONE SODIUM PHOSPHATE 10 MG/ML IJ SOLN
INTRAMUSCULAR | Status: AC
  Filled 2020-04-28: qty 1

## 2020-04-28 MED ORDER — BUPIVACAINE-EPINEPHRINE (PF) 0.25% -1:200000 IJ SOLN
INTRAMUSCULAR | Status: AC
  Filled 2020-04-28: qty 30

## 2020-04-28 MED ORDER — SODIUM CHLORIDE 0.9 % IR SOLN
Status: DC | PRN
  Administered 2020-04-28: 1000 mL

## 2020-04-28 MED ORDER — PROPOFOL 10 MG/ML IV BOLUS
INTRAVENOUS | Status: DC | PRN
  Administered 2020-04-28: 140 mg via INTRAVENOUS

## 2020-04-28 MED ORDER — SUGAMMADEX SODIUM 500 MG/5ML IV SOLN
INTRAVENOUS | Status: DC | PRN
  Administered 2020-04-28: 400 mg via INTRAVENOUS

## 2020-04-28 MED ORDER — OXYCODONE HCL 5 MG PO TABS
5.0000 mg | ORAL_TABLET | Freq: Once | ORAL | Status: AC | PRN
  Administered 2020-04-28: 5 mg via ORAL

## 2020-04-28 MED ORDER — ONDANSETRON HCL 4 MG/2ML IJ SOLN
INTRAMUSCULAR | Status: AC
  Filled 2020-04-28: qty 2

## 2020-04-28 MED ORDER — PROMETHAZINE HCL 25 MG/ML IJ SOLN
6.2500 mg | INTRAMUSCULAR | Status: DC | PRN
  Administered 2020-04-28: 6.25 mg via INTRAVENOUS

## 2020-04-28 MED ORDER — DEXAMETHASONE SODIUM PHOSPHATE 10 MG/ML IJ SOLN
INTRAMUSCULAR | Status: DC | PRN
  Administered 2020-04-28: 10 mg via INTRAVENOUS

## 2020-04-28 MED ORDER — OXYCODONE HCL 5 MG PO TABS
ORAL_TABLET | ORAL | Status: AC
  Administered 2020-04-28: 5 mg via ORAL
  Filled 2020-04-28: qty 1

## 2020-04-28 MED ORDER — FENTANYL CITRATE (PF) 100 MCG/2ML IJ SOLN
INTRAMUSCULAR | Status: DC | PRN
  Administered 2020-04-28: 100 ug via INTRAVENOUS
  Administered 2020-04-28 (×2): 50 ug via INTRAVENOUS

## 2020-04-28 MED ORDER — OXYCODONE HCL 5 MG/5ML PO SOLN: 5.0000 mg | Freq: Once | ORAL | Status: AC | PRN

## 2020-04-28 MED ORDER — LIDOCAINE 2% (20 MG/ML) 5 ML SYRINGE
INTRAMUSCULAR | Status: DC | PRN
  Administered 2020-04-28: 40 mg via INTRAVENOUS

## 2020-04-28 MED ORDER — ACETAMINOPHEN 500 MG PO TABS: 1000.0000 mg | ORAL_TABLET | Freq: Once | ORAL | Status: DC | PRN

## 2020-04-28 SURGICAL SUPPLY — 41 items
BAG DECANTER FOR FLEXI CONT (MISCELLANEOUS) ×3 IMPLANT
BLADE SURG 15 STRL LF DISP TIS (BLADE) ×1 IMPLANT
BLADE SURG 15 STRL SS (BLADE) ×2
BNDG COHESIVE 1X5 TAN STRL LF (GAUZE/BANDAGES/DRESSINGS) ×3 IMPLANT
CATH FOLEY 2WAY SLVR  5CC 18FR (CATHETERS) ×2
CATH FOLEY 2WAY SLVR 5CC 18FR (CATHETERS) ×1 IMPLANT
COVER WAND RF STERILE (DRAPES) IMPLANT
DRAIN PENROSE 0.5X18 (DRAIN) ×3 IMPLANT
DRAPE LAPAROTOMY T 98X78 PEDS (DRAPES) ×3 IMPLANT
DRSG TEGADERM 4X4.75 (GAUZE/BANDAGES/DRESSINGS) ×3 IMPLANT
ELECT REM PT RETURN 15FT ADLT (MISCELLANEOUS) ×3 IMPLANT
GAUZE 4X4 16PLY RFD (DISPOSABLE) ×6 IMPLANT
GAUZE PETROLATUM 1 X8 (GAUZE/BANDAGES/DRESSINGS) ×3 IMPLANT
GAUZE SPONGE 4X4 12PLY STRL (GAUZE/BANDAGES/DRESSINGS) ×3 IMPLANT
GAUZE XEROFORM 1X8 LF (GAUZE/BANDAGES/DRESSINGS) ×3 IMPLANT
GLOVE BIOGEL M STRL SZ7.5 (GLOVE) ×3 IMPLANT
GOWN STRL REUS W/TWL XL LVL3 (GOWN DISPOSABLE) ×3 IMPLANT
IV NS 1000ML (IV SOLUTION) ×2
IV NS 1000ML BAXH (IV SOLUTION) ×1 IMPLANT
KIT BASIN OR (CUSTOM PROCEDURE TRAY) ×3 IMPLANT
KIT TURNOVER KIT A (KITS) IMPLANT
NEEDLE HYPO 22GX1.5 SAFETY (NEEDLE) ×3 IMPLANT
NS IRRIG 1000ML POUR BTL (IV SOLUTION) IMPLANT
PACK GENERAL/GYN (CUSTOM PROCEDURE TRAY) ×3 IMPLANT
PENCIL SMOKE EVACUATOR (MISCELLANEOUS) IMPLANT
PLUG CATH AND CAP STER (CATHETERS) ×3 IMPLANT
SET CYSTO W/LG BORE CLAMP LF (SET/KITS/TRAYS/PACK) ×3 IMPLANT
SURGILUBE 2OZ TUBE FLIPTOP (MISCELLANEOUS) ×3 IMPLANT
SUT CHROMIC 3 0 SH 27 (SUTURE) IMPLANT
SUT CHROMIC 4 0 SH 27 (SUTURE) IMPLANT
SUT PDS AB 3-0 SH 27 (SUTURE) ×6 IMPLANT
SUT SILK 2 0 (SUTURE)
SUT SILK 2 0 SH (SUTURE) ×3 IMPLANT
SUT SILK 2-0 18XBRD TIE 12 (SUTURE) IMPLANT
SUT VIC AB 4-0 PS2 27 (SUTURE) ×3 IMPLANT
SYR 10ML LL (SYRINGE) ×3 IMPLANT
SYR 50ML LL SCALE MARK (SYRINGE) ×3 IMPLANT
SYR CONTROL 10ML LL (SYRINGE) ×3 IMPLANT
TOWEL OR 17X26 10 PK STRL BLUE (TOWEL DISPOSABLE) ×3 IMPLANT
TOWEL OR NON WOVEN STRL DISP B (DISPOSABLE) ×3 IMPLANT
WATER STERILE IRR 1000ML POUR (IV SOLUTION) ×3 IMPLANT

## 2020-04-28 NOTE — Anesthesia Preprocedure Evaluation (Addendum)
Anesthesia Evaluation  Patient identified by MRN, date of birth, ID band Patient awake    Reviewed: Allergy & Precautions, NPO status , Patient's Chart, lab work & pertinent test results  History of Anesthesia Complications Negative for: history of anesthetic complications  Airway Mallampati: I  TM Distance: >3 FB Neck ROM: Full    Dental  (+) Dental Advisory Given, Teeth Intact   Pulmonary neg pulmonary ROS,  Covid-19 Nucleic Acid Test Results Lab Results      Component                Value               Date                      SARSCOV2NAA              NEGATIVE            04/27/2020              breath sounds clear to auscultation       Cardiovascular negative cardio ROS   Rhythm:Regular     Neuro/Psych negative neurological ROS  negative psych ROS   GI/Hepatic negative GI ROS, Neg liver ROS,   Endo/Other  negative endocrine ROS  Renal/GU negative Renal ROS   fractured penis    Musculoskeletal negative musculoskeletal ROS (+)   Abdominal   Peds  Hematology negative hematology ROS (+)   Anesthesia Other Findings   Reproductive/Obstetrics                            Anesthesia Physical Anesthesia Plan  ASA: I  Anesthesia Plan: General   Post-op Pain Management:    Induction: Intravenous  PONV Risk Score and Plan: 2 and Ondansetron and Dexamethasone  Airway Management Planned: Oral ETT  Additional Equipment: None  Intra-op Plan:   Post-operative Plan: Extubation in OR  Informed Consent: I have reviewed the patients History and Physical, chart, labs and discussed the procedure including the risks, benefits and alternatives for the proposed anesthesia with the patient or authorized representative who has indicated his/her understanding and acceptance.     Dental advisory given  Plan Discussed with: CRNA and Surgeon  Anesthesia Plan Comments:          Anesthesia Quick Evaluation

## 2020-04-28 NOTE — Anesthesia Procedure Notes (Signed)
Procedure Name: Intubation Date/Time: 04/28/2020 1:37 AM Performed by: Gerald Leitz, CRNA Pre-anesthesia Checklist: Patient identified, Patient being monitored, Timeout performed, Emergency Drugs available and Suction available Patient Re-evaluated:Patient Re-evaluated prior to induction Oxygen Delivery Method: Circle system utilized Preoxygenation: Pre-oxygenation with 100% oxygen Induction Type: IV induction Ventilation: Mask ventilation without difficulty Laryngoscope Size: Mac and 3 Grade View: Grade I Tube type: Oral Tube size: 7.5 mm Number of attempts: 1 Placement Confirmation: ETT inserted through vocal cords under direct vision,  positive ETCO2 and breath sounds checked- equal and bilateral Secured at: 24 cm Tube secured with: Tape Dental Injury: Teeth and Oropharynx as per pre-operative assessment

## 2020-04-28 NOTE — Discharge Instructions (Signed)
Activity:  No sexual activity for 6 weeks. You are encouraged to ambulate frequently (about every hour during waking hours) to help prevent blood clots from forming in your legs or lungs.  However, you should not engage in any heavy lifting (> 10-15 lbs), strenuous activity, or straining.    Diet: You should advance your diet as instructed by your physician.  It will be normal to have some bloating, nausea, and abdominal discomfort intermittently.   Prescriptions:  You will be provided a prescription for pain medication to take as needed.  If your pain is not severe enough to require the prescription pain medication, you may take extra strength Tylenol instead which will have less side effects.  You should also take a prescribed stool softener to avoid straining with bowel movements as the prescription pain medication may constipate you.   Incisions: Remove the Coban (outer dressing at 6PM on 04/28/2020). You may remove your dressing bandages 48 hours after surgery if not removed in the hospital. Once the bandages are removed (if present), the incisions may stay open to air.  You may start showering (but not soaking or bathing in water) the 2nd day after surgery and the incisions simply need to be patted dry after the shower.  No additional care is needed.   What to call us about: You should call the office [phone]) if you develop fever > 101 or develop persistent vomiting. Activity:  You are encouraged to ambulate frequently (about every hour during waking hours) to help prevent blood clots from forming in your legs or lungs.  However, you should not engage in any heavy lifting (> 10-15 lbs), strenuous activity, or straining.   Post Anesthesia Home Care Instructions  Activity: Get plenty of rest for the remainder of the day. A responsible individual must stay with you for 24 hours following the procedure.  For the next 24 hours, DO NOT: -Drive a car -Advertising copywriter -Drink alcoholic  beverages -Take any medication unless instructed by your physician -Make any legal decisions or sign important papers.  Meals: Start with liquid foods such as gelatin or soup. Progress to regular foods as tolerated. Avoid greasy, spicy, heavy foods. If nausea and/or vomiting occur, drink only clear liquids until the nausea and/or vomiting subsides. Call your physician if vomiting continues.  Special Instructions/Symptoms: Your throat may feel dry or sore from the anesthesia or the breathing tube placed in your throat during surgery. If this causes discomfort, gargle with warm salt water. The discomfort should disappear within 24 hours.  If you had a scopolamine patch placed behind your ear for the management of post- operative nausea and/or vomiting:  1. The medication in the patch is effective for 72 hours, after which it should be removed.  Wrap patch in a tissue and discard in the trash. Wash hands thoroughly with soap and water. 2. You may remove the patch earlier than 72 hours if you experience unpleasant side effects which may include dry mouth, dizziness or visual disturbances. 3. Avoid touching the patch. Wash your hands with soap and water after contact with the patch.     Post Anesthesia Home Care Instructions  Activity: Get plenty of rest for the remainder of the day. A responsible individual must stay with you for 24 hours following the procedure.  For the next 24 hours, DO NOT: -Drive a car -Advertising copywriter -Drink alcoholic beverages -Take any medication unless instructed by your physician -Make any legal decisions or sign important papers.  Meals: Start with liquid foods such as gelatin or soup. Progress to regular foods as tolerated. Avoid greasy, spicy, heavy foods. If nausea and/or vomiting occur, drink only clear liquids until the nausea and/or vomiting subsides. Call your physician if vomiting continues.  Special Instructions/Symptoms: Your throat may feel dry or  sore from the anesthesia or the breathing tube placed in your throat during surgery. If this causes discomfort, gargle with warm salt water. The discomfort should disappear within 24 hours.  If you had a scopolamine patch placed behind your ear for the management of post- operative nausea and/or vomiting:  1. The medication in the patch is effective for 72 hours, after which it should be removed.  Wrap patch in a tissue and discard in the trash. Wash hands thoroughly with soap and water. 2. You may remove the patch earlier than 72 hours if you experience unpleasant side effects which may include dry mouth, dizziness or visual disturbances. 3. Avoid touching the patch. Wash your hands with soap and water after contact with the patch.

## 2020-04-28 NOTE — Transfer of Care (Signed)
Immediate Anesthesia Transfer of Care Note  Patient: Carl Hinton  Procedure(s) Performed: Procedure(s): PENILE EXPLORATION REPAIR OF FRACTURED PENIS (N/A)  Patient Location: PACU  Anesthesia Type:General  Level of Consciousness: Alert, Awake, Oriented  Airway & Oxygen Therapy: Patient Spontanous Breathing  Post-op Assessment: Report given to RN  Post vital signs: Reviewed and stable  Last Vitals:  Vitals:   04/28/20 0030 04/28/20 0045  BP: 119/83 121/87  Pulse: 66 72  Resp:  15  Temp:  36.6 C  SpO2: 98% 98%    Complications: No apparent anesthesia complications

## 2020-04-28 NOTE — Op Note (Signed)
Operative Note  Preoperative diagnosis:  1.  Penile fracture  Postoperative diagnosis: 1.  Traumatic rupture of superficial and deep dorsal veins  Procedure(s): 1.  Penile exploration and ligation of penile veins 2. Dorsal penile nerve block  Surgeon: Jettie Pagan, MD  Assistants:  None  Anesthesia:  General  Complications:  None  EBL:  <35ml  Specimens: 1. None  Drains/Catheters: 1.  none  Intraoperative findings:   1. Traumatic rupture of superficial and deep dorsal veins. Clots were present in this location and after removal there was bleeding from the veins that were controlled. Of note, the site of bleeding was 7cm proximal to the distal tip of the penis. Exploration of the penis with degloving of the penis to the base revealed no evidence of tunica albuginea fracture. Excellent hemostasis. Circumcision incision approximated with 4-0 vicryls.   Indication:  Carl Hinton is a 44 y.o. male presented with a mild discomfort and swollen penis.  He was engaging in sexual intercourse earlier today with his wife and upon pulling out noticed that his penis was edematous.  He did not describe the classic snap or pop sensation.  He did experience detumescence following.  There was no history of acute bending of the penis.  He had no significant penile pain.  Physical examination revealed a circumcised penis that was nontender, dorsally swollen with minimal ecchymosis.  The glans appeared normal.  Given this atypical presentation, MRI of the penis was obtained on 04/28/2020 which revealed a small, apparently incomplete defect of the dorsal tunica albuginea overlying the left corpus cavernosum, near the base, approximately 7 cm from the tip of the outstretched penis, suspicious for small perineal fracture. Given this finding, he is being brought to the operating room for penile exploration and repair of penile fracture.  Description of procedure: The indications, alternatives, benefits  and risks were discussed with the patient and informed consent was obtained.  The patient was brought into the operating room table, positioned supine, and secured with a safety strap.  All pressure points were carefully padded and pneumatic compression devices were placed on the lower extremities.  After the administration of intravenous antibiotics and general anesthesia, the genitalia were prepped and draped in the standard sterile manner.  A time was completed, verifying the correct patient, surgical procedure, and positioning, prior to beginning the procedure.    A 16 French urethral catheter was placed for urethral identification and bladder drainage.  A circumferential penile skin incision was made 1 cm below the coronal sulcus and carried down through the Dartos and superficial Buck's fascias.  The penis was carefully degloved, using sharp and blunt dissection, exposing both corpora cavernosa and the corpora spongiosum.  Meticulous hemostasis was achieved with electrocautery. The wound was irrigated with sterile normal saline and the clot was evacuated.  With the clot removed, it was apparent that his superficial and deep dorsal veins were traumatically ruptured as they were actively bleeding upon removal of the clot.  I secured these veins with interrupted 2-0 silk sutures.  Of note, this area of injury to the veins was measured to be 7 cm distal from the tip of the penis corresponding to the area of injury on the preoperative MRI of the penis. Following ligation of veins, there was no further bleeding.  The penis was degloved down to its base and thoroughly explored.  There was no evidence of any underlying tunica albuginea disruption. The urethral catheter was removed, sterile normal saline was instilled through the  meatus, and the urethra was inspected, confirming its patency.  Hemostasis was again confirmed and the penis examined for any additional injuries.  There were no additional injuries.   Hemostasis was excellent.  The dartos fascia and penile skin were reapproximated separately using 4-0 Vicryl sutures.  Xeroform gauze was placed on the suture line, a bio-occlusive dressing was applied loosely and the penis was wrapped loosely with a self adherent Coban dressing.  At the end the procedure, all counts were correct.  The patient tolerated the procedure well and was taken to the recovery room in stable condition.  Plan: Patient will discharge home with 1 week course of Cipro. He will remove Coban dressing later today and Bioclusive will be removed in 2 days. He has been instructed to abstain from sexual intercourse for 6 weeks. He will follow-up in the office for wound check and to assess for erectile function in the coming weeks.  [name] MD [facility]  Pager: [phone]

## 2020-04-29 ENCOUNTER — Encounter (HOSPITAL_COMMUNITY): Payer: Self-pay | Admitting: Urology

## 2020-05-06 NOTE — Anesthesia Postprocedure Evaluation (Signed)
Anesthesia Post Note  Patient: Carl Hinton  Procedure(s) Performed: PENILE EXPLORATION REPAIR OF RUPTURED DORSAL VEIN (N/A )     Patient location during evaluation: PACU Anesthesia Type: General Level of consciousness: awake and alert Pain management: pain level controlled Vital Signs Assessment: post-procedure vital signs reviewed and stable Respiratory status: spontaneous breathing, nonlabored ventilation, respiratory function stable and patient connected to nasal cannula oxygen Cardiovascular status: blood pressure returned to baseline and stable Postop Assessment: no apparent nausea or vomiting Anesthetic complications: no   No complications documented.  Last Vitals:  Vitals:   04/28/20 0336 04/28/20 0351  BP: 132/87 136/87  Pulse:    Resp: 16 15  Temp:  (!) 36.3 C  SpO2:  100%    Last Pain:  Vitals:   04/28/20 0330  TempSrc:   PainSc: 4                  Nathalia Wismer
